# Patient Record
Sex: Female | Born: 1990 | Hispanic: Yes | Marital: Married | Smoking: Never smoker
Health system: Southern US, Community
[De-identification: ages and names within clinical notes are randomized; demographics above are authoritative.]

---

## 2018-06-26 ENCOUNTER — Other Ambulatory Visit: Payer: Self-pay

## 2018-06-26 ENCOUNTER — Ambulatory Visit (INDEPENDENT_AMBULATORY_CARE_PROVIDER_SITE_OTHER): Payer: Self-pay

## 2018-06-26 ENCOUNTER — Encounter: Payer: Self-pay | Admitting: Physician Assistant

## 2018-06-26 ENCOUNTER — Ambulatory Visit (INDEPENDENT_AMBULATORY_CARE_PROVIDER_SITE_OTHER): Payer: Self-pay | Admitting: Physician Assistant

## 2018-06-26 VITALS — BP 104/72 | HR 84 | Temp 98.7°F | Resp 16 | Ht 61.0 in | Wt 127.2 lb

## 2018-06-26 DIAGNOSIS — R1084 Generalized abdominal pain: Secondary | ICD-10-CM

## 2018-06-26 DIAGNOSIS — K5900 Constipation, unspecified: Secondary | ICD-10-CM

## 2018-06-26 MED ORDER — POLYETHYLENE GLYCOL 3350 17 GM/SCOOP PO POWD: 17.0000 g | Freq: Two times a day (BID) | ORAL | 1 refills | Status: AC | PRN

## 2018-06-26 NOTE — Progress Notes (Signed)
   Cassandra Lin  MRN: [id number] DOB: November 21, 1990  PCP: Patient, No Pcp Per  Subjective:  Pt is a 27 year old female who presents to clinic for stomach pain x 1 week.  Pain is located under ribs "moves around" and and lower abdomen.  Started in the middle of her upper stomach. And is now lateral upper stomach. Feels sharp.  Pain is there when she takes a deep breath. Pain moves around. If she sleeps on her right the right side hurts.  Pain is 8/10. Pain is improving over the past week.  Also happens when she bends over.  Sometimes burning in the chest.  Sometimes pain after she eats - different kind of pain.  No pain with eating.  Last BM yesterday.  She has not taken anything for this.  Never had this problem before.  Denies constipation or diarrhea, fever, chills, cough, decreased appetite.   Review of Systems  Constitutional: Negative for chills and fever.  Gastrointestinal: Positive for abdominal pain. Negative for blood in stool, constipation, diarrhea, nausea, rectal pain and vomiting.    There are no active problems to display for this patient.   No current outpatient medications on file prior to visit.   No current facility-administered medications on file prior to visit.     No Known Allergies   Objective:  BP 104/72 (BP Location: Left Arm, Patient Position: Sitting, Cuff Size: Normal)   Pulse 84   Temp 98.7 F (37.1 C) (Oral)   Resp 16   Ht 5\' 1"  (1.549 m)   Wt 127 lb 3.2 oz (57.7 kg)   LMP 06/24/2018   SpO2 100%   BMI 24.03 kg/m   Physical Exam  Constitutional: She appears well-developed and well-nourished. No distress.  Abdominal: Soft. Normal appearance and bowel sounds are normal. There is no tenderness. There is no guarding, no tenderness at McBurney's point and negative Murphy's sign.  Skin: Skin is warm and dry.  Psychiatric: She has a normal mood and affect. Thought content normal.  Vitals reviewed.  Dg Abd 2 Views  Result Date:  06/26/2018 CLINICAL DATA:  Generalized abdominal pain for 1.5 weeks EXAM: ABDOMEN - 2 VIEW COMPARISON:  None. FINDINGS: Formed stool throughout the colon. No obstruction or rectal impaction. No concerning mass effect or calcification. Mild lumbar dextrocurvature. IMPRESSION: Generalized stool retention suggesting constipation. Electronically Signed   By: Marnee Spring M.D.   On: 06/26/2018 09:40    Assessment and Plan :  1. Constipation, unspecified constipation type 2. Generalized abdominal pain - Pt presents c/o upper b/l abdominal pain. No red flags. Vitals are stable. X-ray suggests constipation. Plan to treat with miralax. RTC after 1-2 weeks if no improvement.  - DG Abd 2 Views; Future - polyethylene glycol powder (GLYCOLAX/MIRALAX) powder; Take 17 g by mouth 2 (two) times daily as needed.  Dispense: 578 g; Refill: 1   [name], PA-C  [facility] 06/26/2018 8:44 AM  Please note: Portions of this report may have been transcribed using dragon voice recognition software. Every effort was made to ensure accuracy; however, inadvertent computerized transcription errors may be present.

## 2018-06-26 NOTE — Patient Instructions (Addendum)
Your x-ray shows constipation.    For gentle treatment of constipation: Use Miralax 1-2 capfuls a day until your stools are soft and regular and then decrease the usage - you can use this daily   Look up "6 Reasons to Care about [place]" at [place] ([email or web address])    1) Water: Make sure you are drinking enough water daily - about 1-3 liters. 2) Fiber: Make sure you are getting enough fiber in your diet - this will make you regular - you can eat high fiber foods or use metamucil as a supplement - it is really important to drink enough water when using fiber supplements. Foods that have a lot of fiber include vegetables, fruits, beans, nuts, oatmeal, and some breads and cereals. You can tell how much fiber is in a food by reading the nutrition label. Doctors recommend eating 25 to 36 grams of fiber each day. 3) Fitness: Increasing your physical activity will help increase the natural movement of your bowels. Try to get 20-30 minutes of exercise daily.  4) Use a 9" stool in front of your toilet to rest your feet on while you have a bowel movement. This will loosen your rectal muscles to help your stool come out easier and prevent straining.   ?Refrain from straining or lingering (eg, reading) on the toilet.  ?If possible, patients should avoid medications that can cause constipation or diarrhea. ?Limit intake of fatty foods and alcohol, which can exacerbate constipation.     Come back if you are not improving after 1-2 weeks.   Thank you for coming in today. I hope you feel we met your needs.  Feel free to call PCP if you have any questions or further requests.  Please consider signing up for MyChart if you do not already have it, as this is a great way to communicate with me.  Best,  Whitney McVey, PA-C   IF you received an x-ray today, you will receive an invoice from [facility]. Please contact [facility] at [phone]  with questions or concerns regarding your invoice.   IF you received labwork today, you will receive an invoice from [facility]. Please contact [facility] at [phone] with questions or concerns regarding your invoice.   Our billing staff will not be able to assist you with questions regarding bills from these companies.  You will be contacted with the lab results as soon as they are available. The fastest way to get your results is to activate your My Chart account. Instructions are located on the last page of this paperwork. If you have not heard from [place] regarding the results in 2 weeks, please contact this office.

## 2018-07-11 ENCOUNTER — Ambulatory Visit: Payer: Self-pay | Admitting: *Deleted

## 2018-07-11 NOTE — Telephone Encounter (Signed)
Pt states pain at left upper abdomen "At rib cage area", onset 3-4 days ago, radiates to back with deep breaths, worse today. . Seen 06/26/18 by Huntley Estelle for abdominal pain; states "This pain is different."  Abd. films at that time showed: "Generalized stool retention suggesting constipation."  Pt states has been having daily, normal BMs since taking Miralax; LBM this AM "Normal."  States pain is dull constant but worsens with deep breaths 7-8/10.  Reports SOB at rest. Pt directed to ED. States will follow disposition. Care advise given per protocol.  Reason for Disposition . [1] SEVERE pain (e.g., excruciating) AND [2] present > 1 hour    Worsens with deep breaths, SOB at rest  Answer Assessment - Initial Assessment Questions 1. LOCATION: "Where does it hurt?"      At left rib cage area, radiates to back with deep breaths 2. RADIATION: "Does the pain shoot anywhere else?" (e.g., chest, back)     Left  back area 3. ONSET: "When did the pain begin?" (e.g., minutes, hours or days ago)      3-4 days ago, worsening today with deep breaths 4. SUDDEN: "Gradual or sudden onset?"     sudden 5. PATTERN "Does the pain come and go, or is it constant?"    - If constant: "Is it getting better, staying the same, or worsening?"      (Note: Constant means the pain never goes away completely; most serious pain is constant and it progresses)     - If intermittent: "How long does it last?" "Do you have pain now?"     (Note: Intermittent means the pain goes away completely between bouts)     Always there but worse with deep breath 6. SEVERITY: "How bad is the pain?"  (e.g., Scale 1-10; mild, moderate, or severe)   - MILD (1-3): doesn't interfere with normal activities, abdomen soft and not tender to touch    - MODERATE (4-7): interferes with normal activities or awakens from sleep, tender to touch    - SEVERE (8-10): excruciating pain, doubled over, unable to do any normal activities      7-8/10 7. RECURRENT  SYMPTOM: "Have you ever had this type of abdominal pain before?" If so, ask: "When was the last time?" and "What happened that time?"     " Yes but this pain is different." Seen 06/26/18 by [name] 8. CAUSE: "What do you think is causing the abdominal pain?"     unsure 9. RELIEVING/AGGRAVATING FACTORS: "What makes it better or worse?" (e.g., movement, antacids, bowel movement)     no 10. OTHER SYMPTOMS: "Has there been any vomiting, diarrhea, constipation, or urine problems?"       LBM this am; states has been having normal daily BMs with Miralax 11. PREGNANCY: "Is there any chance you are pregnant?" "When was your last menstrual period?"       no  Protocols used: ABDOMINAL PAIN - [facility]

## 2018-07-13 NOTE — Telephone Encounter (Signed)
LMOVM checking on pt.  Advised to call clinic with any needs.

## 2019-05-22 IMAGING — DX DG ABDOMEN 2V
2 series · 2 of 2 positions shown · non-contrast
Comparison: None.

CLINICAL DATA: Generalized abdominal pain for 1.5 weeks

EXAM:
ABDOMEN - 2 VIEW

[abdomen erect]
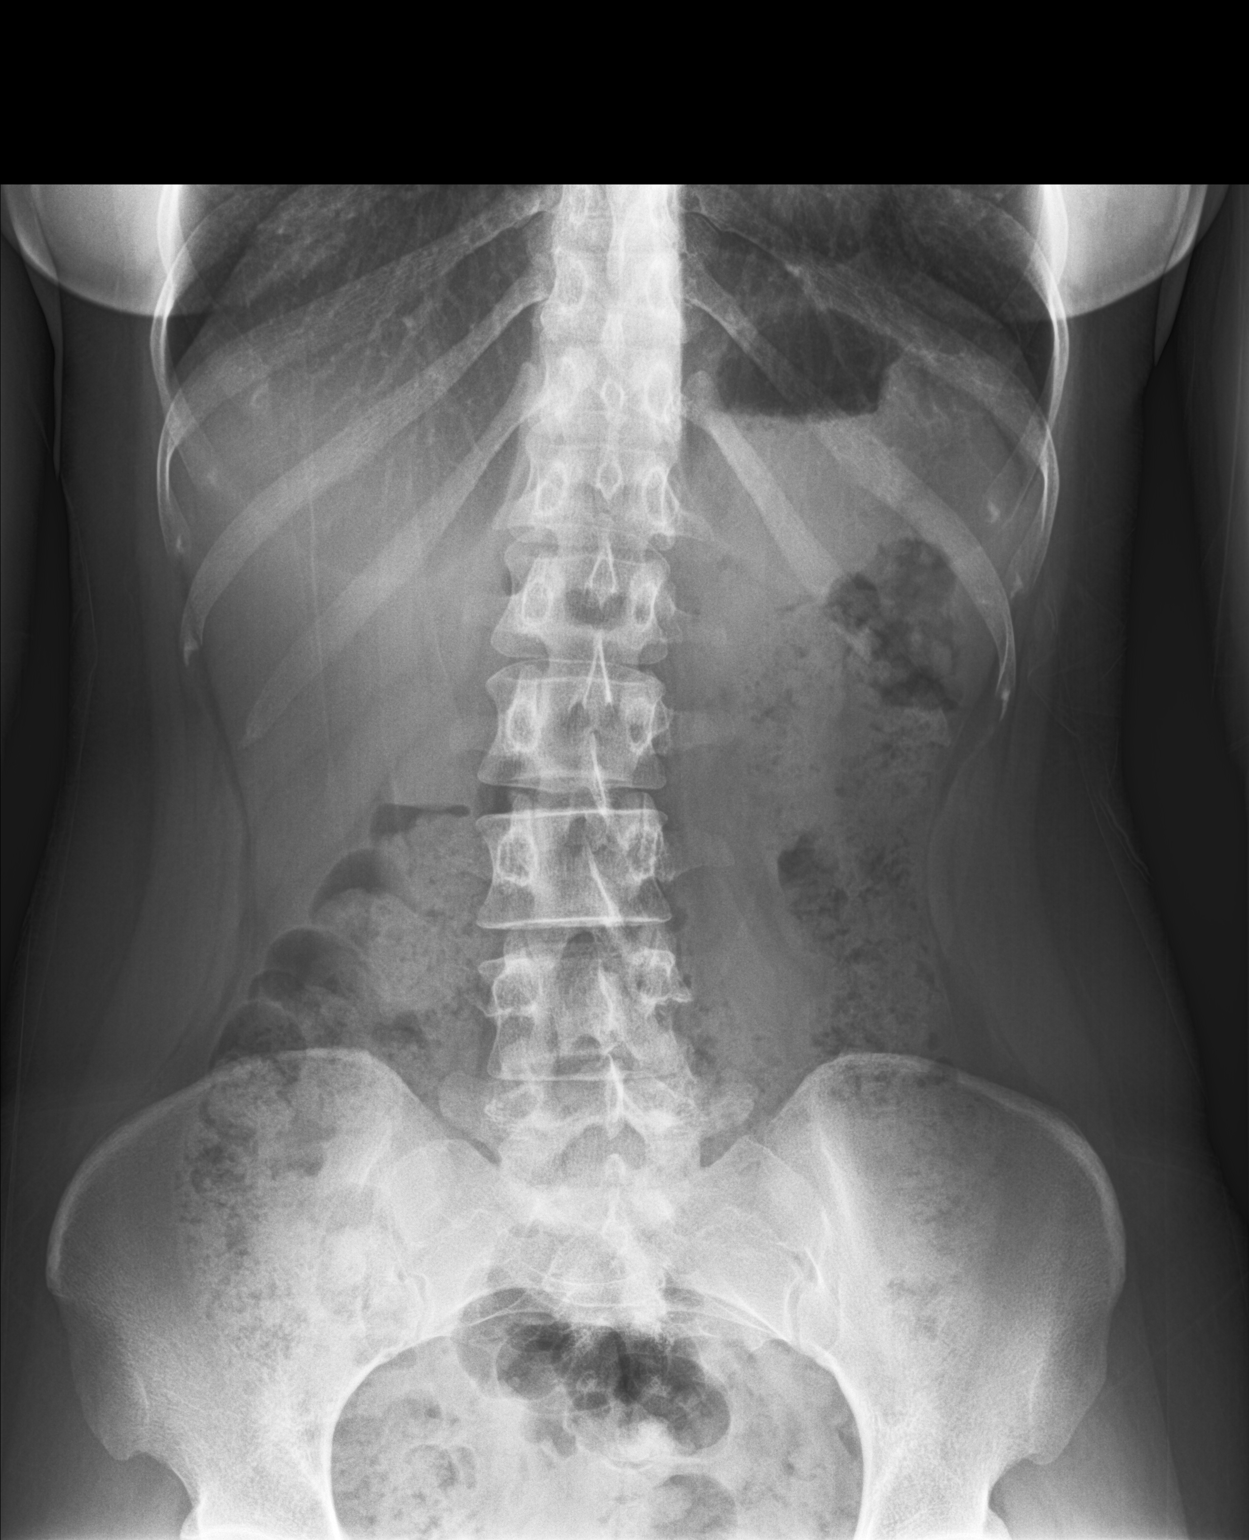

[abdomen supine]
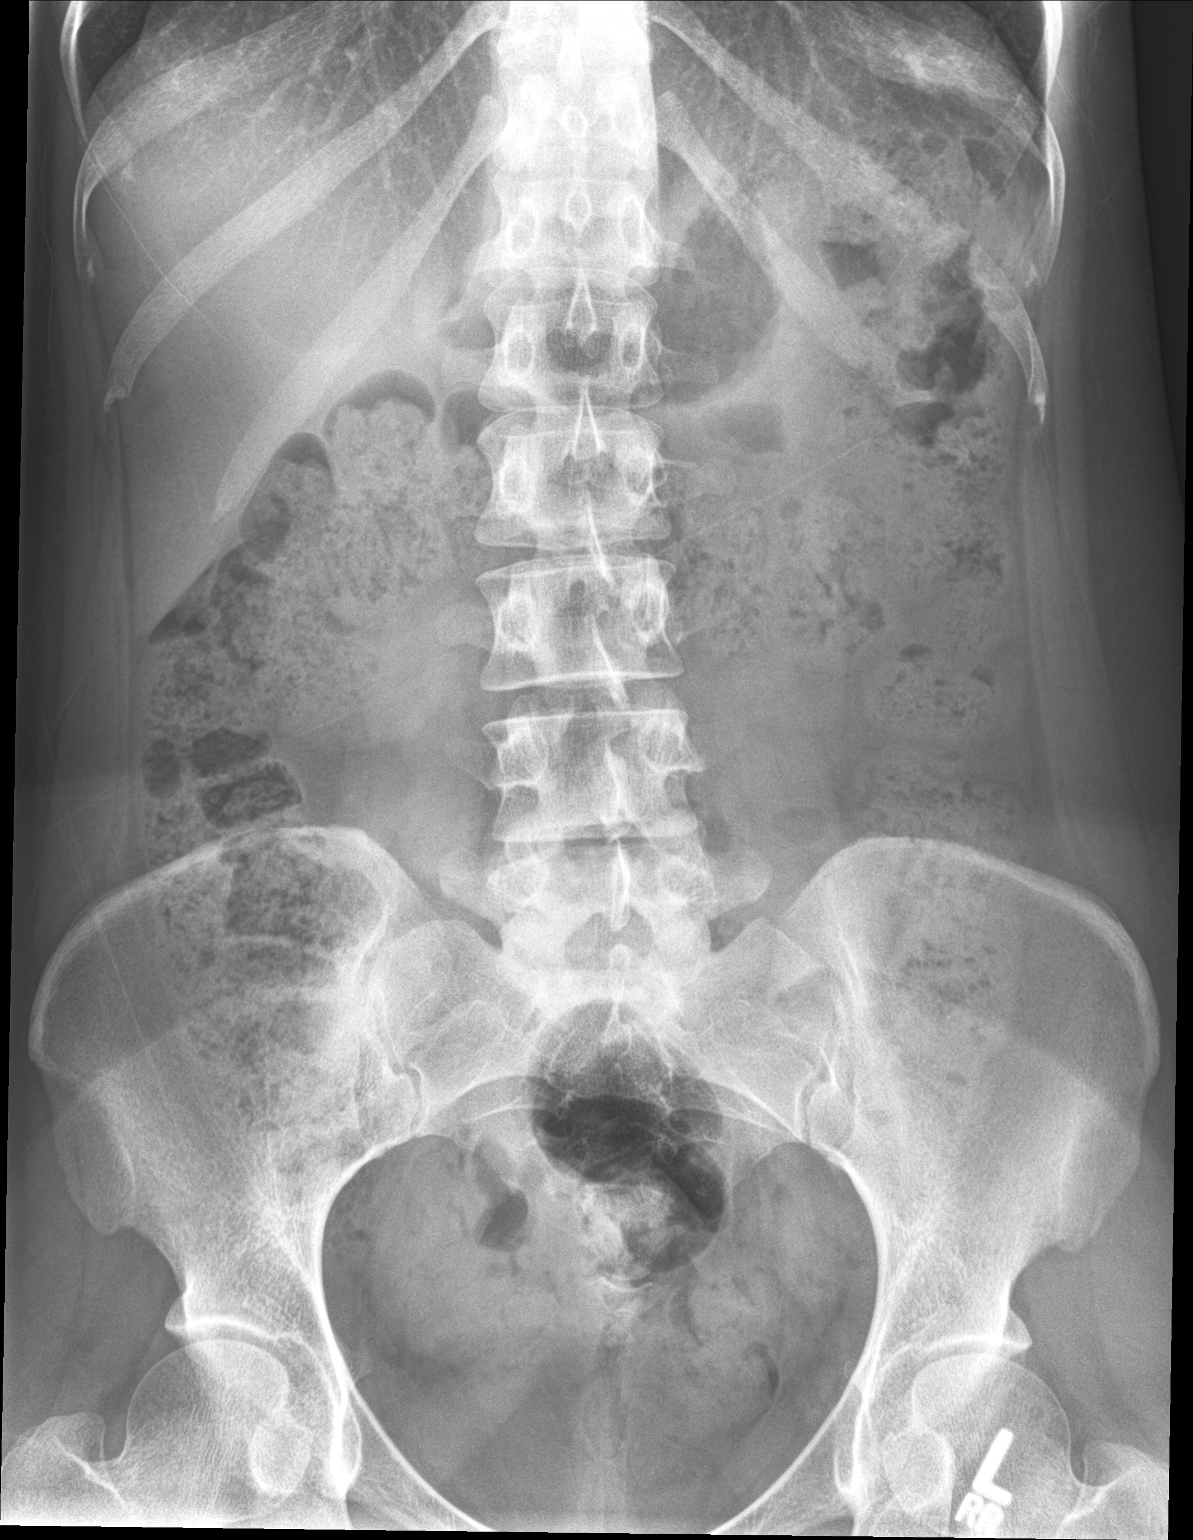

[2 of 2 positions shown; findings below may reference images not displayed]

FINDINGS: Formed stool throughout the colon. No obstruction or rectal
impaction. No concerning mass effect or calcification.

Mild lumbar dextrocurvature.
IMPRESSION: Generalized stool retention suggesting constipation.
